# Patient Record
Sex: Female | Born: 1970 | Hispanic: Yes | Marital: Single | Smoking: Never smoker
Health system: Southern US, Community
[De-identification: ages and names within clinical notes are randomized; demographics above are authoritative.]

## PROBLEM LIST (undated history)

## (undated) DIAGNOSIS — J45909 Unspecified asthma, uncomplicated: Secondary | ICD-10-CM

## (undated) HISTORY — DX: Unspecified asthma, uncomplicated: J45.909

---

## 2018-08-16 ENCOUNTER — Other Ambulatory Visit: Payer: Self-pay | Admitting: *Deleted

## 2018-08-16 DIAGNOSIS — Z20822 Contact with and (suspected) exposure to covid-19: Secondary | ICD-10-CM

## 2018-08-22 LAB — NOVEL CORONAVIRUS, NAA: SARS-CoV-2, NAA: DETECTED — AB

## 2018-08-27 ENCOUNTER — Other Ambulatory Visit: Payer: Self-pay

## 2018-08-27 ENCOUNTER — Emergency Department (HOSPITAL_COMMUNITY)
Admission: EM | Admit: 2018-08-27 | Discharge: 2018-08-27 | Disposition: A | Discharge status: Home / Self Care | Payer: Self-pay | Attending: Emergency Medicine | Admitting: Emergency Medicine

## 2018-08-27 ENCOUNTER — Encounter (HOSPITAL_COMMUNITY): Payer: Self-pay

## 2018-08-27 ENCOUNTER — Emergency Department (HOSPITAL_COMMUNITY): Payer: Self-pay

## 2018-08-27 DIAGNOSIS — J45909 Unspecified asthma, uncomplicated: Secondary | ICD-10-CM | POA: Insufficient documentation

## 2018-08-27 DIAGNOSIS — R5383 Other fatigue: Secondary | ICD-10-CM | POA: Insufficient documentation

## 2018-08-27 DIAGNOSIS — R0602 Shortness of breath: Secondary | ICD-10-CM | POA: Insufficient documentation

## 2018-08-27 DIAGNOSIS — R091 Pleurisy: Secondary | ICD-10-CM | POA: Insufficient documentation

## 2018-08-27 DIAGNOSIS — U071 COVID-19: Secondary | ICD-10-CM | POA: Insufficient documentation

## 2018-08-27 LAB — CBC
HCT: 35.5 % — ABNORMAL LOW (ref 36.0–46.0)
Hemoglobin: 11.2 g/dL — ABNORMAL LOW (ref 12.0–15.0)
MCH: 29.2 pg (ref 26.0–34.0)
MCHC: 31.5 g/dL (ref 30.0–36.0)
MCV: 92.4 fL (ref 80.0–100.0)
Platelets: 397 10*3/uL (ref 150–400)
RBC: 3.84 MIL/uL — ABNORMAL LOW (ref 3.87–5.11)
RDW: 13.6 % (ref 11.5–15.5)
WBC: 10.1 10*3/uL (ref 4.0–10.5)
nRBC: 0 % (ref 0.0–0.2)

## 2018-08-27 LAB — D-DIMER, QUANTITATIVE: D-Dimer, Quant: <0.27 ug/mL-FEU (ref 0.00–0.50)

## 2018-08-27 LAB — I-STAT BETA HCG BLOOD, ED (MC, WL, AP ONLY): I-stat hCG, quantitative: <5 m[IU]/mL (ref <5)

## 2018-08-27 LAB — TROPONIN I (HIGH SENSITIVITY): Troponin I (High Sensitivity): 3 ng/L (ref <18)

## 2018-08-27 LAB — BASIC METABOLIC PANEL
Anion gap: 12 (ref 5–15)
BUN: 19 mg/dL (ref 6–20)
CO2: 24 mmol/L (ref 22–32)
Calcium: 8.9 mg/dL (ref 8.9–10.3)
Chloride: 104 mmol/L (ref 98–111)
Creatinine, Ser: 0.72 mg/dL (ref 0.44–1.00)
GFR calc Af Amer: >60 mL/min (ref >60)
GFR calc non Af Amer: >60 mL/min (ref >60)
Glucose, Bld: 114 mg/dL — ABNORMAL HIGH (ref 70–99)
Potassium: 3.6 mmol/L (ref 3.5–5.1)
Sodium: 140 mmol/L (ref 135–145)

## 2018-08-27 LAB — LACTIC ACID, PLASMA: Lactic Acid, Venous: 0.9 mmol/L (ref 0.5–1.9)

## 2018-08-27 MED ORDER — HYDROCODONE-ACETAMINOPHEN 5-325 MG PO TABS: 1.0000 | ORAL_TABLET | Freq: Four times a day (QID) | ORAL | 0 refills | Status: AC | PRN

## 2018-08-27 MED ORDER — SODIUM CHLORIDE 0.9% FLUSH
3.0000 mL | Freq: Once | INTRAVENOUS | Status: AC
  Administered 2018-08-27: 03:00:00 3 mL via INTRAVENOUS

## 2018-08-27 MED ORDER — FENTANYL CITRATE (PF) 100 MCG/2ML IJ SOLN
100.0000 ug | Freq: Once | INTRAMUSCULAR | Status: AC
  Administered 2018-08-27: 100 ug via INTRAVENOUS
  Filled 2018-08-27: qty 2

## 2018-08-27 MED ORDER — HYDROCODONE-ACETAMINOPHEN 5-325 MG PO TABS
1.0000 | ORAL_TABLET | Freq: Once | ORAL | Status: AC
  Administered 2018-08-27: 1 via ORAL
  Filled 2018-08-27: qty 1

## 2018-08-27 NOTE — ED Provider Notes (Signed)
[facility] DEPT Provider Note   CSN: [id number] Arrival date & time: 08/27/18  0042    History   Chief Complaint Chief Complaint  Patient presents with  . Chest Pain    HPI Jessica Schultz is a 48 y.o. female.  HPI: A 48 year old patient presents for evaluation of chest pain. Initial onset of pain was more than 6 hours ago. The patient's chest pain is sharp and is not worse with exertion. The patient's chest pain is middle- or left-sided, is not well-localized, is not described as heaviness/pressure/tightness and does not radiate to the arms/jaw/neck. The patient does not complain of nausea and denies diaphoresis. The patient has a family history of coronary artery disease in a first-degree relative with onset less than age 66. The patient has no history of stroke, has no history of peripheral artery disease, has not smoked in the past 90 days, denies any history of treated diabetes, is not hypertensive, has no history of hypercholesterolemia and does not have an elevated BMI (>=30).    Chest Pain Pain location:  L chest Pain quality: sharp   Timing:  Constant Progression:  Worsening Chronicity:  New Context: breathing   Relieved by:  Nothing Worsened by:  Deep breathing Associated symptoms: cough, fatigue and shortness of breath   Associated symptoms: no abdominal pain   Risk factors: no coronary artery disease, no diabetes mellitus, no hypertension, no prior DVT/PE and no smoking   Patient reports she was diagnosed with COVID-19 about 10 days ago.  She was exposed at her new job.  She has had some cough.  Overall she felt that her symptoms were improving from COVID.  However over the past 4 to 5 days she has had left-sided chest pain that sharp, worse with breathing and she is short of breath.  In the past 24 hours there has been no hemoptysis and no fever.    PMH- [facility] hx - nonsmoker, recently moved from [place] hx - father with CAD,  died at age 40 OB History   No obstetric history on file.      Home Medications    Prior to Admission medications   Medication Sig Start Date End Date Taking? Authorizing Provider  albuterol (PROVENTIL) (2.5 MG/3ML) 0.083% nebulizer solution Take 2.5 mg by nebulization every 6 (six) hours as needed for wheezing or shortness of breath.   Yes [provider]  albuterol (VENTOLIN HFA) 108 (90 Base) MCG/ACT inhaler Inhale 1-2 puffs into the lungs every 6 (six) hours as needed for wheezing or shortness of breath.   Yes [provider]  HYDROcodone-acetaminophen (NORCO/VICODIN) 5-325 MG tablet Take 1 tablet by mouth every 6 (six) hours as needed for severe pain. 08/27/18   Jessica Fraise, MD    Family History History reviewed. No pertinent family history.  Social History Social History   Tobacco Use  . Smoking status: Never Smoker  . Smokeless tobacco: Never Used  Substance Use Topics  . Alcohol use: Not Currently  . Drug use: Not Currently     Allergies   Asa [aspirin]   Review of Systems Review of Systems  Constitutional: Positive for fatigue.  Respiratory: Positive for cough and shortness of breath.   Cardiovascular: Positive for chest pain and leg swelling.  Gastrointestinal: Negative for abdominal pain.  All other systems reviewed and are negative.    Physical Exam Updated Vital Signs BP (!) 144/77   Pulse 66   Temp 98.4 F (36.9 C) (Rectal)  Resp 17   Ht 1.6 m (5\' 3" )   Wt 90.7 kg   LMP 08/12/2018   SpO2 99%   BMI 35.43 kg/m   Physical Exam CONSTITUTIONAL: Well developed/well nourished, uncomfortable appearing HEAD: Normocephalic/atraumatic EYES: EOMI ENMT: Mucous membranes moist NECK: supple no meningeal signs SPINE/BACK:entire spine nontender CV: S1/S2 noted, no murmurs/rubs/gallops noted LUNGS: Lungs are clear to auscultation bilaterally, no apparent distress Chest - tenderness along left chest, no crepitus or bruising.  No  rash noted ABDOMEN: soft, nontender, no rebound or guarding, bowel sounds noted throughout abdomen GU:no cva tenderness NEURO: Pt is awake/alert/appropriate, moves all extremitiesx4.  No facial droop.   EXTREMITIES: pulses normal/equal, full ROM, no calf tenderness/edema SKIN: warm, color normal PSYCH: Mildly anxious  ED Treatments / Results  Labs (all labs ordered are listed, but only abnormal results are displayed) Labs Reviewed  BASIC METABOLIC PANEL - Abnormal; Notable for the following components:      Result Value   Glucose, Bld 114 (*)    All other components within normal limits  CBC - Abnormal; Notable for the following components:   RBC 3.84 (*)    Hemoglobin 11.2 (*)    HCT 35.5 (*)    All other components within normal limits  LACTIC ACID, PLASMA  D-DIMER, QUANTITATIVE (NOT AT [facility])  LACTIC ACID, PLASMA  I-STAT BETA HCG BLOOD, ED ([facility], [facility], [facility] ONLY)  TROPONIN I (HIGH SENSITIVITY)    EKG EKG Interpretation  Date/Time:  Saturday August 27 2018 01:11:55 EDT Ventricular Rate:  66 PR Interval:    QRS Duration: 113 QT Interval:  416 QTC Calculation: 436 R Axis:   65 Text Interpretation:  Sinus rhythm Borderline intraventricular conduction delay RSR' in V1 or V2, right VCD or RVH No previous ECGs available Confirmed by Zadie RhineWickline, Deardra Hinkley ([id number]) on 08/27/2018 1:16:41 AM   Radiology Dg Chest Port 1 View  Result Date: 08/27/2018 CLINICAL DATA:  48 year old female with chest pain. Shortness of breath. EXAM: PORTABLE CHEST 1 VIEW COMPARISON:  None. FINDINGS: The heart size and mediastinal contours are within normal limits. Both lungs are clear. The visualized skeletal structures are unremarkable. IMPRESSION: No active disease. Electronically Signed   By: Elgie CollardArash  Radparvar M.D.   On: 08/27/2018 02:02    Procedures Procedures   Medications Ordered in ED Medications  HYDROcodone-acetaminophen (NORCO/VICODIN) 5-325 MG per tablet 1 tablet (has no administration in time range)   sodium chloride flush (NS) 0.9 % injection 3 mL (3 mLs Intravenous Given 08/27/18 0244)  fentaNYL (SUBLIMAZE) injection 100 mcg (100 mcg Intravenous Given 08/27/18 0244)     Initial Impression / Assessment and Plan / ED Course  I have reviewed the triage vital signs and the nursing notes.  Pertinent labs & imaging results that were available during my care of the patient were reviewed by me and considered in my medical decision making (see chart for details).     HEAR Score: 3 3:38 AM She presents with left-sided chest pain for the past 5 days She reports being diagnosed with COVID-19 about 10 days ago, and actually was improving until she began having chest pain.  Her chest is worse with palpation and deep breathing.  It is mostly sharp in nature.  No recent hemoptysis.  She is low risk for ACS, she has had a negative troponin.  No acute EKG changes.  Chest x-ray was reviewed and is negative.  D-dimer is negative, low suspicion for acute PE at this time. Patient is beginning to feel improved.  Patient is appropriate for discharge home.  Due to aspirin and anti-inflammatory allergy, will give a short course of Vicodin     Kaleen OdeaSusana Binion was evaluated in Emergency Department on 08/27/2018 for the symptoms described in the history of present illness. She was evaluated in the context of the global COVID-19 pandemic, which necessitated consideration that the patient might be at risk for infection with the SARS-CoV-2 virus that causes COVID-19. Institutional protocols and algorithms that pertain to the evaluation of patients at risk for COVID-19 are in a state of rapid change based on information released by regulatory bodies including the CDC and federal and state organizations. These policies and algorithms were followed during the patient's care in the ED.  Final Clinical Impressions(s) / ED Diagnoses   Final diagnoses:  Pleurisy    ED Discharge Orders         Ordered     HYDROcodone-acetaminophen (NORCO/VICODIN) 5-325 MG tablet  Every 6 hours PRN     08/27/18 0332           Zadie RhineWickline, Kaytlynn Kochan, MD 08/27/18 [phone]

## 2018-08-27 NOTE — Progress Notes (Signed)
Patient ID: Jessica Schultz, female   DOB: [birth year], 48 y.o.   MRN: [id number] Virtual Visit via Telephone Note  I connected with Jessica OdeaSusana Stitely on 08/29/18 at 10:30 AM EDT by telephone and verified that I am speaking with the correct person using two identifiers.   Consent:  I discussed the limitations, risks, security and privacy concerns of performing an evaluation and management service by telephone and the availability of in person appointments. I also discussed with the patient that there may be a patient responsible charge related to this service. The patient expressed understanding and agreed to proceed.  Location of patient: The patient was at home  Location of provider: I was in my office  Persons participating in the televisit with the patient.   No one else was with the patient    History of Present Illness: This is a 48 year old female who began having the onset on July 2 of fever muscle aches shortness of breath and cough.  There was a potential COVID exposure and so because of this she [facility] local [facility] but there was a delay in getting a result so she then went [facility].  That test came back positive and interestingly she was [facility] a [facility] stating that her [facility] COVID test was positive as well.  When I spoke to the patient on 18 July giving her the result she later in the day went to the emergency room for evaluation.  A chest x-ray was done and showed no active process.  She had significant chest discomfort at that time and she now is having diarrhea as well.  She has significant headache and loss of smell.  She still having low-grade fevers.  She has increased cough and wheezing with underlying history of asthma.  Her appetite is poor and she is having abdominal pain as well.  The patient also complains of diffuse muscle aches as well.  Emergency room gave the patient a  prescription of narcotic     Constitutional:     weight loss, night sweats,  Fevers, chills, fatigue, lassitude. HEENT:    headaches,  Difficulty swallowing,  Tooth/dental problems,  Sore throat,                No sneezing, itching, ear ache, nasal congestion, post nasal drip,   CV:   chest pain,  Orthopnea, PND, swelling in lower extremities, anasarca, dizziness, palpitations  GI  No heartburn, indigestion, abdominal pain, nausea, vomiting, diarrhea, change in bowel habits, loss of appetite  Resp:  shortness of breath with exertion or at rest.  No excess mucus, no productive cough,   non-productive cough,  No coughing up of blood.  No change in color of mucus.   wheezing.  No chest wall deformity  Skin: no rash or lesions.  GU: no dysuria, change in color of urine, no urgency or frequency.  No flank pain.  MS:  No joint pain or swelling.  No decreased range of motion.   back pain.  Psych:  No change in mood or affect. No depression or anxiety.  No memory loss.  Observations/Objective: No observations as this was a telephone visit  Assessment and Plan: #1 COVID-19 infection proven by recent testing.  Associated asthma as well  Plan will be to [facility] and nebulized solution and also prescribe benzonatate 200 mg 3 times  daily as needed for cough.  The patient was advised to continue to push fluids and as well to take the hydrocodone acetaminophen as needed for pain    Follow Up Instructions: We plan to see this patient back again in 1 week using the video tele visit   I discussed the assessment and treatment plan with the patient. The patient was provided an opportunity to ask questions and all were answered. The patient agreed with the plan and demonstrated an understanding of the instructions.   The patient was advised to call back or seek an in-person evaluation if the symptoms worsen or if the condition fails to improve as anticipated.  I provided25  minutes of non-face-to-face time during this encounter  including  median intraservice time , review of notes, labs, imaging, medications  and explaining diagnosis and management to the patient .    Asencion Noble, MD

## 2018-08-27 NOTE — ED Triage Notes (Signed)
Patient is complaining of chest pain, sob, and left side pain beside left breast. Patient states she has been hurting for about 5 days.

## 2018-08-27 NOTE — ED Triage Notes (Signed)
Pt reports L sided flank pain that wraps around to her sternum. Recently tested positive for COVID.

## 2018-08-29 ENCOUNTER — Ambulatory Visit: Payer: HRSA Program | Attending: Critical Care Medicine | Admitting: Critical Care Medicine

## 2018-08-29 ENCOUNTER — Encounter: Payer: Self-pay | Admitting: Critical Care Medicine

## 2018-08-29 ENCOUNTER — Other Ambulatory Visit: Payer: Self-pay

## 2018-08-29 DIAGNOSIS — U071 COVID-19: Secondary | ICD-10-CM

## 2018-08-29 DIAGNOSIS — R51 Headache: Secondary | ICD-10-CM | POA: Diagnosis not present

## 2018-08-29 DIAGNOSIS — R0602 Shortness of breath: Secondary | ICD-10-CM | POA: Diagnosis not present

## 2018-08-29 DIAGNOSIS — J45909 Unspecified asthma, uncomplicated: Secondary | ICD-10-CM

## 2018-08-29 DIAGNOSIS — M5489 Other dorsalgia: Secondary | ICD-10-CM

## 2018-08-29 MED ORDER — ALBUTEROL SULFATE (2.5 MG/3ML) 0.083% IN NEBU: 2.5000 mg | INHALATION_SOLUTION | Freq: Four times a day (QID) | RESPIRATORY_TRACT | 1 refills | Status: AC | PRN

## 2018-08-29 MED ORDER — ALBUTEROL SULFATE HFA 108 (90 BASE) MCG/ACT IN AERS: 1.0000 | INHALATION_SPRAY | Freq: Four times a day (QID) | RESPIRATORY_TRACT | 1 refills | Status: AC | PRN

## 2018-08-29 MED ORDER — BENZONATATE 100 MG PO CAPS: 200.0000 mg | ORAL_CAPSULE | Freq: Three times a day (TID) | ORAL | 0 refills | Status: AC | PRN

## 2018-08-29 MED FILL — ALBUTEROL SUL 2.5 MG/3 ML S: (2.5 MG/3ML | 6 days supply | Qty: 75 | Fill #0

## 2018-08-29 MED FILL — BENZONATATE 100 MG CAPS: 100 | 7 days supply | Qty: 40 | Fill #0

## 2018-08-29 MED FILL — ALBUTEROL SULFATE HFA 108 (: 108 (90 BAS | 25 days supply | Qty: 18 | Fill #0

## 2018-08-29 NOTE — Progress Notes (Signed)
Pt states her left lung is still giving her discomfort   Pt states she has little cough

## 2018-08-30 ENCOUNTER — Other Ambulatory Visit: Payer: Self-pay | Admitting: *Deleted

## 2018-08-30 DIAGNOSIS — Z20822 Contact with and (suspected) exposure to covid-19: Secondary | ICD-10-CM

## 2018-08-31 ENCOUNTER — Encounter: Payer: Self-pay | Admitting: Critical Care Medicine

## 2018-08-31 ENCOUNTER — Telehealth: Payer: Self-pay

## 2018-08-31 NOTE — Telephone Encounter (Signed)
Pt called wanting to follow up on her letter for work stating that she's still in quarantine due to Covid. Pt is also stating that her right ear and throat is hurting..please follow up

## 2018-08-31 NOTE — Telephone Encounter (Signed)
Letter is done.

## 2018-09-04 LAB — NOVEL CORONAVIRUS, NAA: SARS-CoV-2, NAA: NOT DETECTED

## 2018-09-04 NOTE — Progress Notes (Signed)
Patient ID: Jessica Schultz, female   DOB: 07-23-1970, 48 y.o.   MRN: [id number] Virtual Visit via Video Note  I connected with@ on 09/04/18 at@ by a telephone enabled telemedicine application and verified that I am speaking with the correct person using two identifiers.   Consent:  I discussed the limitations, risks, security and privacy concerns of performing an evaluation and management service by video visit and the availability of in person appointments. I also discussed with the patient that there may be a patient responsible charge related to this service. The patient expressed understanding and agreed to proceed.  Location of patient: at home   Location of provider: in my office  Persons participating in the televisit with the patient.   No one else on the call  History of Present Illness: This is a follow-up telephone visit from 1 week ago. This is a 48 year old female who began having the onset on July 2 of fever muscle aches shortness of breath and cough.  There was a potential COVID exposure and so because of this [facility] but there was a delay in getting a result so she then went [facility].  That test came back positive and interestingly she was [facility] that her [facility] COVID test was positive as well.  When I spoke to the patient on 18 July giving her the result she later in the day went to the emergency room for evaluation.  A chest x-ray was done and showed no active process.  She had significant chest discomfort at that time and she now is having diarrhea as well.  She has significant headache and loss of smell.  She still having low-grade fevers.  She has increased cough and wheezing with underlying history of asthma.  Her appetite is poor and she is having abdominal pain as well.  The patient also complains of diffuse muscle aches as  well.  Emergency room gave the patient a prescription of narcotic .  The patient says she had a nasal swab for COVID but there is no result in the system nor is there an order for the COVID test  The patient did go back to the community testing event on July 22 and was tested there and the result is negative.  The patient states overall her symptoms are better.  She is no longer having aches or pains her cough is minimal she has occasional dizziness.  She still has occasional chest discomfort.  She is no longer having fever.  She states many other workers at an [employer] that she worked at are of been ill and she is not returned to work because of that.  She is concerned that she was tested for COVID in the emergency room on 18 July but there was no result.  She was asking some follow-up for this.   Pos in [place]  Constitutional:     weight loss, night sweats,  Fevers, chills, fatigue, lassitude. HEENT:    headaches,  Difficulty swallowing,  Tooth/dental problems,  Sore throat,                No sneezing, itching, ear ache, nasal congestion, post nasal drip,   CV:   chest pain,  Orthopnea, PND, swelling in lower extremities, anasarca, dizziness, palpitations  GI  No heartburn, indigestion, abdominal pain, nausea, vomiting,  diarrhea, change in bowel habits, loss of appetite  Resp:  shortness of breath with exertion or at rest.  No excess mucus, no productive cough,   non-productive cough,  No coughing up of blood.  No change in color of mucus.   wheezing.  No chest wall deformity  Skin: no rash or lesions.  GU: no dysuria, change in color of urine, no urgency or frequency.  No flank pain.  MS:  No joint pain or swelling.  No decreased range of motion.   back pain.  Psych:  No change in mood or affect. No depression or anxiety.  No memory loss.  Observations/Objective: No observations as this was a telephone visit  Assessment and Plan: #1 COVID-19 infection proven by recent testing.   Associated asthma as well  NOW RESOLVING   I indicated to the patient she could return to work however she states that she is reluctant to as her other still ill on the job.  I did tell her she could end her self-isolation.  I will schedule an in office exam the week of August 17 so she can establish and as well have follow-up for asthma    Follow Up Instructions: The patient understands there will be a follow-up in office exam the week of August 17  I also emailed her letter indicating she was COVID positive so she can give to her employer   I discussed the assessment and treatment plan with the patient. The patient was provided an opportunity to ask questions and all were answered. The patient agreed with the plan and demonstrated an understanding of the instructions.   The patient was advised to call back or seek an in-person evaluation if the symptoms worsen or if the condition fails to improve as anticipated.  I provided 30 minutes of non-face-to-face time during this encounter  including  median intraservice time , review of notes, labs, imaging, medications  and explaining diagnosis and management to the patient .    Jessica Noble, MD

## 2018-09-05 ENCOUNTER — Telehealth (HOSPITAL_BASED_OUTPATIENT_CLINIC_OR_DEPARTMENT_OTHER): Payer: HRSA Program | Admitting: Critical Care Medicine

## 2018-09-05 ENCOUNTER — Encounter: Payer: Self-pay | Admitting: Critical Care Medicine

## 2018-09-05 DIAGNOSIS — U071 COVID-19: Secondary | ICD-10-CM

## 2018-10-05 ENCOUNTER — Ambulatory Visit: Payer: Self-pay | Admitting: Critical Care Medicine

## 2018-10-05 NOTE — Progress Notes (Deleted)
   Subjective:    Patient ID: Jessica Schultz, female    DOB: 09-03-1970, 48 y.o.   MRN: [id number]  This is a 48 year old female who began having the onset on July 2 of fever muscle aches shortness of breath and cough.  There was a potential COVID exposure and so because of this [facility] but there was a delay in getting a result so she then went to a local testing event on July 8 at [facility] sponsored by [employer].  That test came back positive and interestingly she was [facility] that her [facility] COVID test was positive as well.  When I spoke to the patient on 18 July giving her the result she later in the day went to the emergency room for evaluation.  A chest x-ray was done and showed no active process.  She had significant chest discomfort at that time and she now is having diarrhea as well.  She has significant headache and loss of smell.  She still having low-grade fevers.  She has increased cough and wheezing with underlying history of asthma.  Her appetite is poor and she is having abdominal pain as well.  The patient also complains of diffuse muscle aches as well.  Emergency room gave the patient a prescription of narcotic .  The patient says she had a nasal swab for COVID but there is no result in the system nor is there an order for the COVID test  The patient did go back to the community testing event on July 22 and was tested there and the result is negative.  The patient states overall her symptoms are better.  She is no longer having aches or pains her cough is minimal she has occasional dizziness.  She still has occasional chest discomfort.  She is no longer having fever.  She states many other workers at an [employer] that she worked at are of been ill and she is not returned to work because of that.  She is concerned that she was tested for COVID in the emergency room on 18 July but there was no result.  She  was asking some follow-up for this.   Assessment and Plan: #1 COVID-19 infection proven by recent testing.  Associated asthma as well  NOW RESOLVING              I indicated to the patient she could return to work however she states that she is reluctant to as her other still ill on the job.  I did tell her she could end her self-isolation.      Review of Systems     Objective:   Physical Exam        Assessment & Plan:

## 2020-08-05 IMAGING — DX PORTABLE CHEST - 1 VIEW
1 series · 1 of 1 positions shown · non-contrast
Comparison: None.

CLINICAL DATA: 47-year-old female with chest pain. Shortness of
breath.

EXAM:
PORTABLE CHEST 1 VIEW

[chest ap]
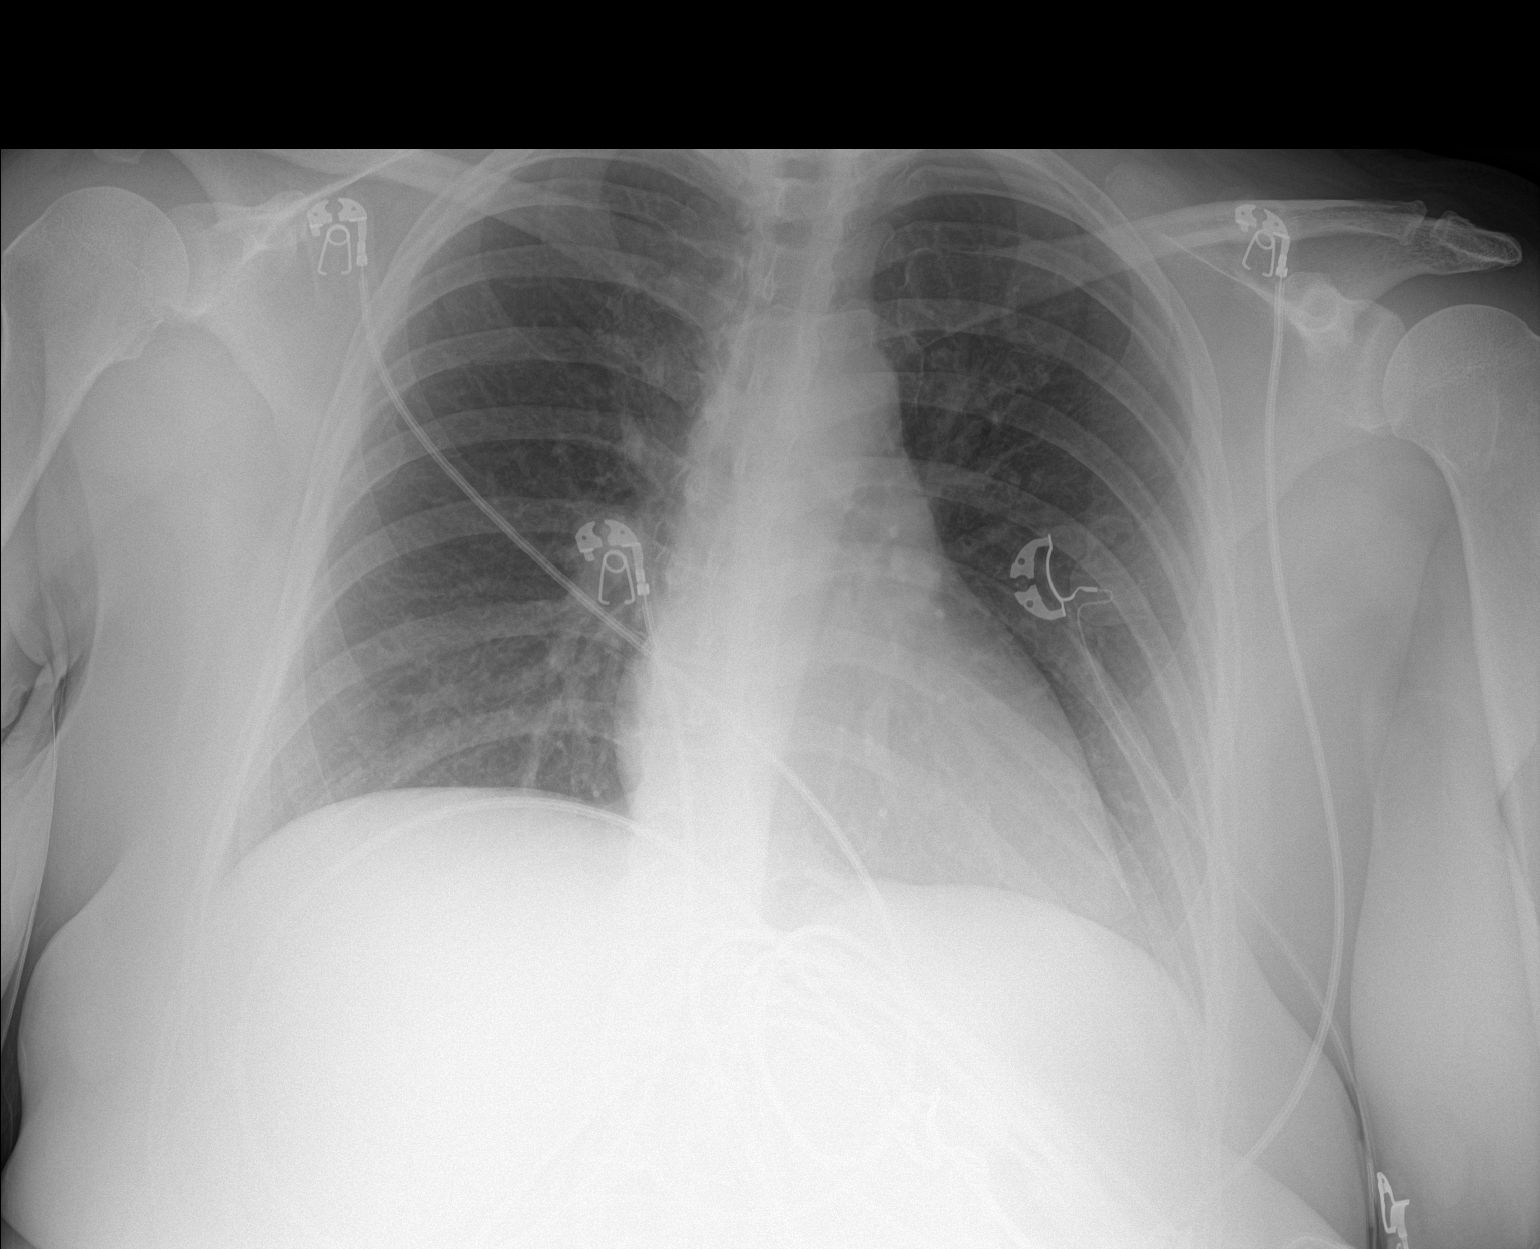

[1 of 1 positions shown; findings below may reference images not displayed]

FINDINGS: The heart size and mediastinal contours are within normal limits.
Both lungs are clear. The visualized skeletal structures are
unremarkable.
IMPRESSION: No active disease.
